# Patient Record
Sex: Male | Born: 1975 | Race: Black or African American | Hispanic: No | Marital: Single
Health system: Southern US, Community
[De-identification: ages and names within clinical notes are randomized; demographics above are authoritative.]

## PROBLEM LIST (undated history)

## (undated) DIAGNOSIS — Z981 Arthrodesis status: Secondary | ICD-10-CM

## (undated) DIAGNOSIS — S4990XA Unspecified injury of shoulder and upper arm, unspecified arm, initial encounter: Secondary | ICD-10-CM

---

## 2020-02-06 ENCOUNTER — Emergency Department (HOSPITAL_COMMUNITY): Payer: Self-pay

## 2020-02-06 ENCOUNTER — Emergency Department (HOSPITAL_COMMUNITY)
Admission: EM | Admit: 2020-02-06 | Discharge: 2020-02-06 | Disposition: A | Discharge status: Home / Self Care | Payer: Self-pay | Attending: Emergency Medicine | Admitting: Emergency Medicine

## 2020-02-06 ENCOUNTER — Other Ambulatory Visit: Payer: Self-pay

## 2020-02-06 ENCOUNTER — Encounter (HOSPITAL_COMMUNITY): Payer: Self-pay | Admitting: Emergency Medicine

## 2020-02-06 DIAGNOSIS — M25512 Pain in left shoulder: Secondary | ICD-10-CM | POA: Insufficient documentation

## 2020-02-06 DIAGNOSIS — M25519 Pain in unspecified shoulder: Secondary | ICD-10-CM

## 2020-02-06 DIAGNOSIS — M25511 Pain in right shoulder: Secondary | ICD-10-CM | POA: Insufficient documentation

## 2020-02-06 HISTORY — DX: Unspecified injury of shoulder and upper arm, unspecified arm, initial encounter: S49.90XA

## 2020-02-06 HISTORY — DX: Arthrodesis status: Z98.1

## 2020-02-06 MED ORDER — HYDROCODONE-ACETAMINOPHEN 5-325 MG PO TABS
1.0000 | ORAL_TABLET | Freq: Once | ORAL | Status: AC
  Administered 2020-02-06: 1 via ORAL
  Filled 2020-02-06: qty 1

## 2020-02-06 MED ORDER — CYCLOBENZAPRINE HCL 10 MG PO TABS
5.0000 mg | ORAL_TABLET | Freq: Once | ORAL | Status: AC
  Administered 2020-02-06: 5 mg via ORAL
  Filled 2020-02-06: qty 1

## 2020-02-06 MED ORDER — PREDNISONE 10 MG PO TABS: 40.0000 mg | ORAL_TABLET | Freq: Every day | ORAL | 0 refills | Status: AC

## 2020-02-06 MED ORDER — CYCLOBENZAPRINE HCL 10 MG PO TABS: 10.0000 mg | ORAL_TABLET | Freq: Two times a day (BID) | ORAL | 0 refills | Status: AC | PRN

## 2020-02-06 MED ORDER — PREDNISONE 20 MG PO TABS
60.0000 mg | ORAL_TABLET | Freq: Once | ORAL | Status: AC
  Administered 2020-02-06: 60 mg via ORAL
  Filled 2020-02-06: qty 3
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
## 2020-02-06 NOTE — Discharge Instructions (Signed)
You are seen today for shoulder pain.  Your x-rays were completely normal.  I believe you may have a rotator cuff or other muscular or tendinous issue.  I have referred you to orthopedics for further work-up and treatment can be given.  I have sent some prescriptions to the pharmacy for you to take.
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
## 2020-04-21 ENCOUNTER — Emergency Department (HOSPITAL_COMMUNITY): Admission: EM | Admit: 2020-04-21 | Discharge: 2020-04-21 | Discharge status: Absent without leave | Payer: Self-pay

## 2020-07-29 ENCOUNTER — Encounter (HOSPITAL_COMMUNITY): Payer: Self-pay | Admitting: Emergency Medicine

## 2020-07-29 ENCOUNTER — Emergency Department (HOSPITAL_COMMUNITY)
Admission: EM | Admit: 2020-07-29 | Discharge: 2020-07-29 | Disposition: A | Discharge status: Home / Self Care | Payer: Self-pay | Attending: Emergency Medicine | Admitting: Emergency Medicine

## 2020-07-29 ENCOUNTER — Other Ambulatory Visit: Payer: Self-pay

## 2020-07-29 DIAGNOSIS — M25511 Pain in right shoulder: Secondary | ICD-10-CM | POA: Insufficient documentation

## 2020-07-29 DIAGNOSIS — M25512 Pain in left shoulder: Secondary | ICD-10-CM | POA: Insufficient documentation

## 2020-07-29 DIAGNOSIS — M5459 Other low back pain: Secondary | ICD-10-CM | POA: Insufficient documentation

## 2020-07-29 DIAGNOSIS — G8929 Other chronic pain: Secondary | ICD-10-CM | POA: Insufficient documentation
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
## 2020-07-29 NOTE — Discharge Instructions (Signed)
You have been evaluated at the emergency department and exam look reassuring.  I recommend taking over-the-counter pain medications like ibuprofen and or Tylenol every 6 hours as needed for pain.  Please follow dosing on the back of bottle.    I have given you information for chronic pain please contact these facility as they can help you manage your pain.  I have also given you the contact information for  for community health and wellness they work with individuals little to no insurance can help you find a primary care provider.  I feel you need further management by PCP.  Come back to emergency department if develop severe chest pain, shortness of breath, uncontrolled nausea, vomiting, diarrhea.

## 2020-07-29 NOTE — ED Notes (Signed)
Pt left prior to receiving AVS. Ambulatory out of dept.

## 2020-07-29 NOTE — ED Triage Notes (Signed)
Pt arrives to ED by POV with complaints of not being able to manage his chronic pain. Specifically neck, back, and shoulders.

## 2020-08-12 ENCOUNTER — Encounter (HOSPITAL_COMMUNITY): Payer: Self-pay

## 2020-08-12 ENCOUNTER — Emergency Department (HOSPITAL_COMMUNITY)
Admission: EM | Admit: 2020-08-12 | Discharge: 2020-08-13 | Disposition: A | Discharge status: Home / Self Care | Payer: Self-pay | Attending: Emergency Medicine | Admitting: Emergency Medicine

## 2020-08-12 ENCOUNTER — Emergency Department (HOSPITAL_COMMUNITY): Payer: Self-pay

## 2020-08-12 DIAGNOSIS — G8929 Other chronic pain: Secondary | ICD-10-CM | POA: Insufficient documentation

## 2020-08-12 DIAGNOSIS — M25511 Pain in right shoulder: Secondary | ICD-10-CM | POA: Insufficient documentation
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
## 2020-08-13 ENCOUNTER — Encounter (HOSPITAL_COMMUNITY): Payer: Self-pay | Admitting: Emergency Medicine

## 2020-08-13 MED ORDER — IBUPROFEN 800 MG PO TABS
800.0000 mg | ORAL_TABLET | Freq: Once | ORAL | Status: AC
  Administered 2020-08-13: 800 mg via ORAL
  Filled 2020-08-13: qty 1

## 2020-08-13 MED ORDER — ACETAMINOPHEN 500 MG PO TABS
1000.0000 mg | ORAL_TABLET | Freq: Once | ORAL | Status: AC
  Administered 2020-08-13: 1000 mg via ORAL
  Filled 2020-08-13: qty 2

## 2020-08-13 MED ORDER — LIDOCAINE 5 % EX PTCH
1.0000 | MEDICATED_PATCH | CUTANEOUS | Status: DC
  Administered 2020-08-13: 1 via TRANSDERMAL
  Filled 2020-08-13: qty 1

## 2020-08-13 MED ORDER — LIDOCAINE 5 % EX PTCH: 1.0000 | MEDICATED_PATCH | CUTANEOUS | 0 refills | Status: AC

## 2020-08-13 NOTE — ED Notes (Signed)
Discharge instructions reviewed with patient and patient verbalized understanding. Patient was educated on the importance of following up with ortho. Patient vss and nad upon dc. Patient ambulatory without assist.
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
## 2021-06-11 IMAGING — CR DG SHOULDER 2+V*R*
3 series · 3 of 3 positions shown · non-contrast
Comparison: 02/06/2020

CLINICAL DATA: Shoulder pain

EXAM:
RIGHT SHOULDER - 2+ VIEW

[shoulder grashey]
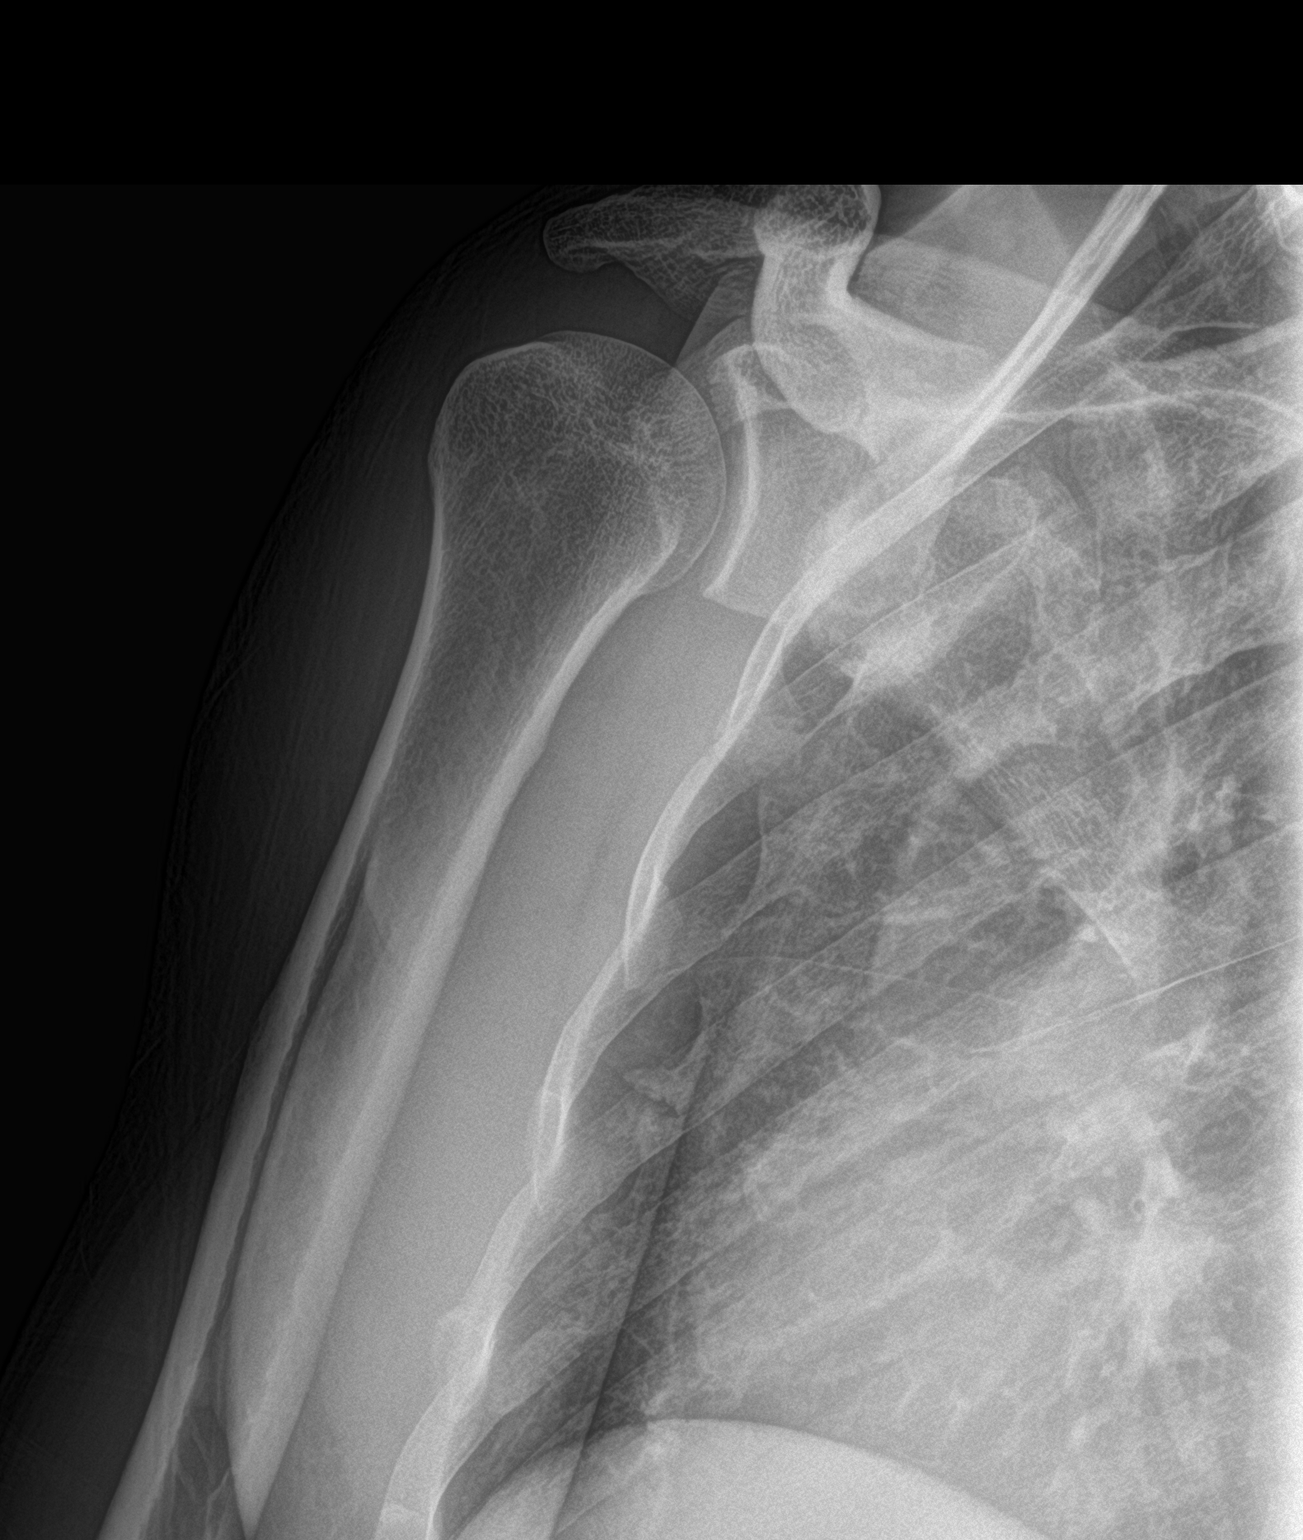

[shoulder y view]
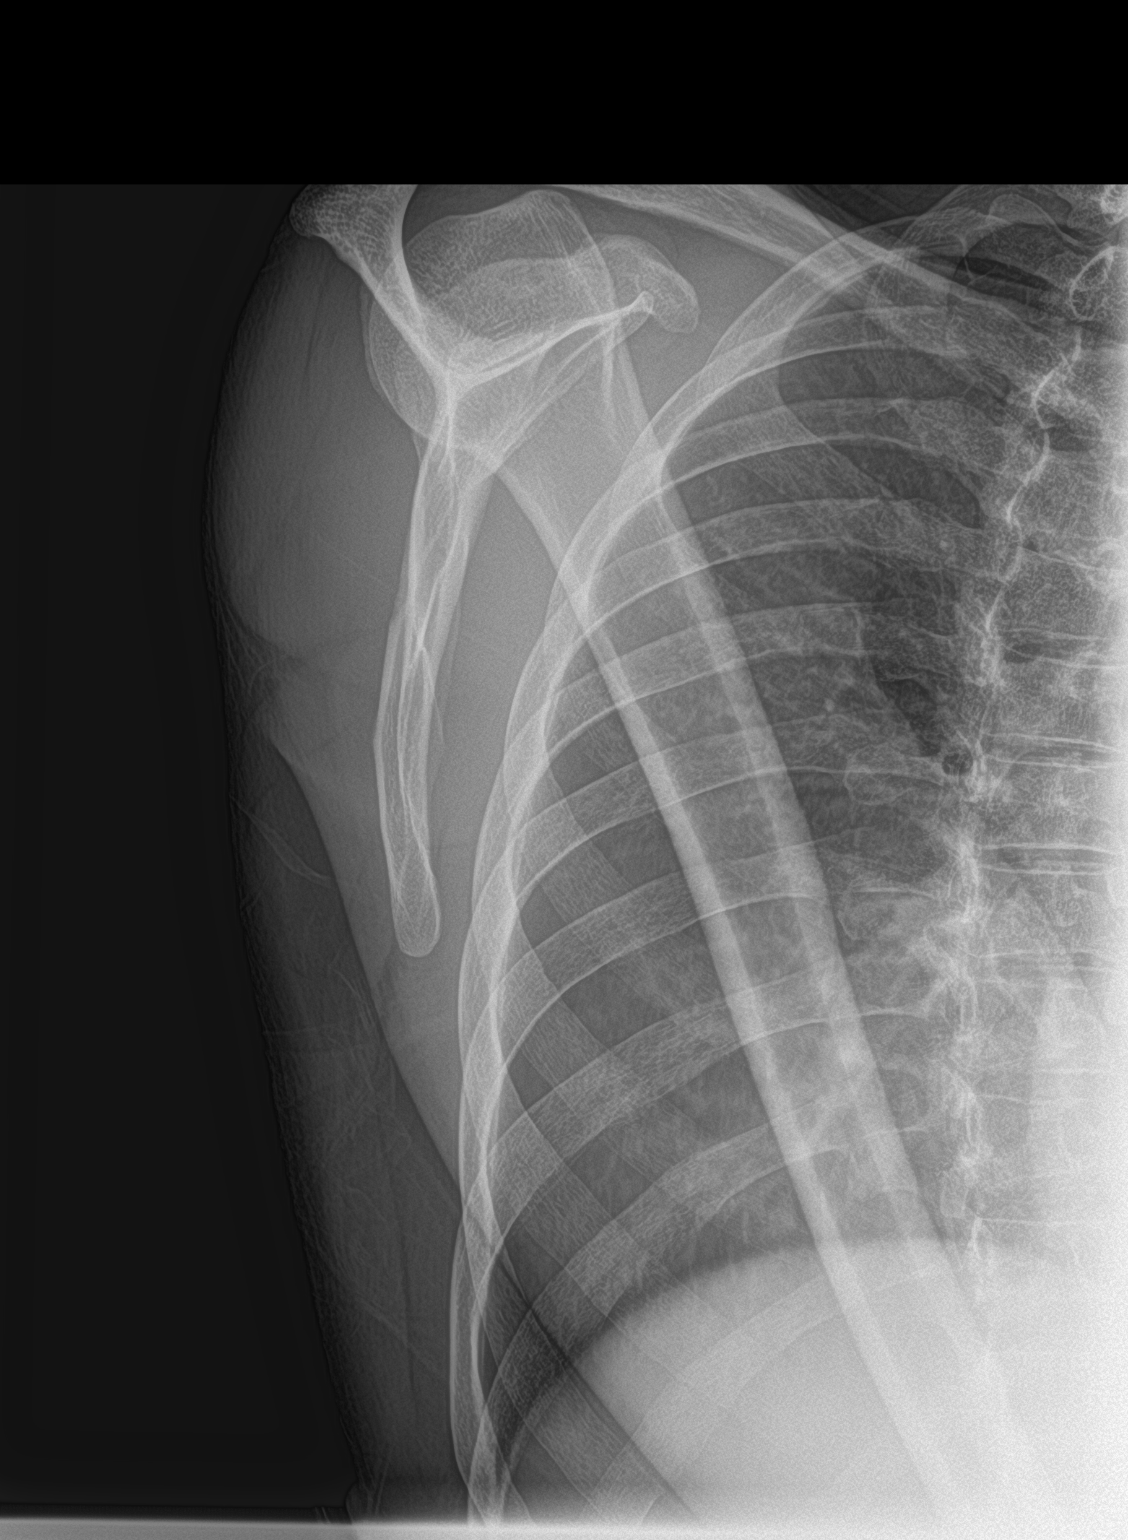

[shoulder axillary]
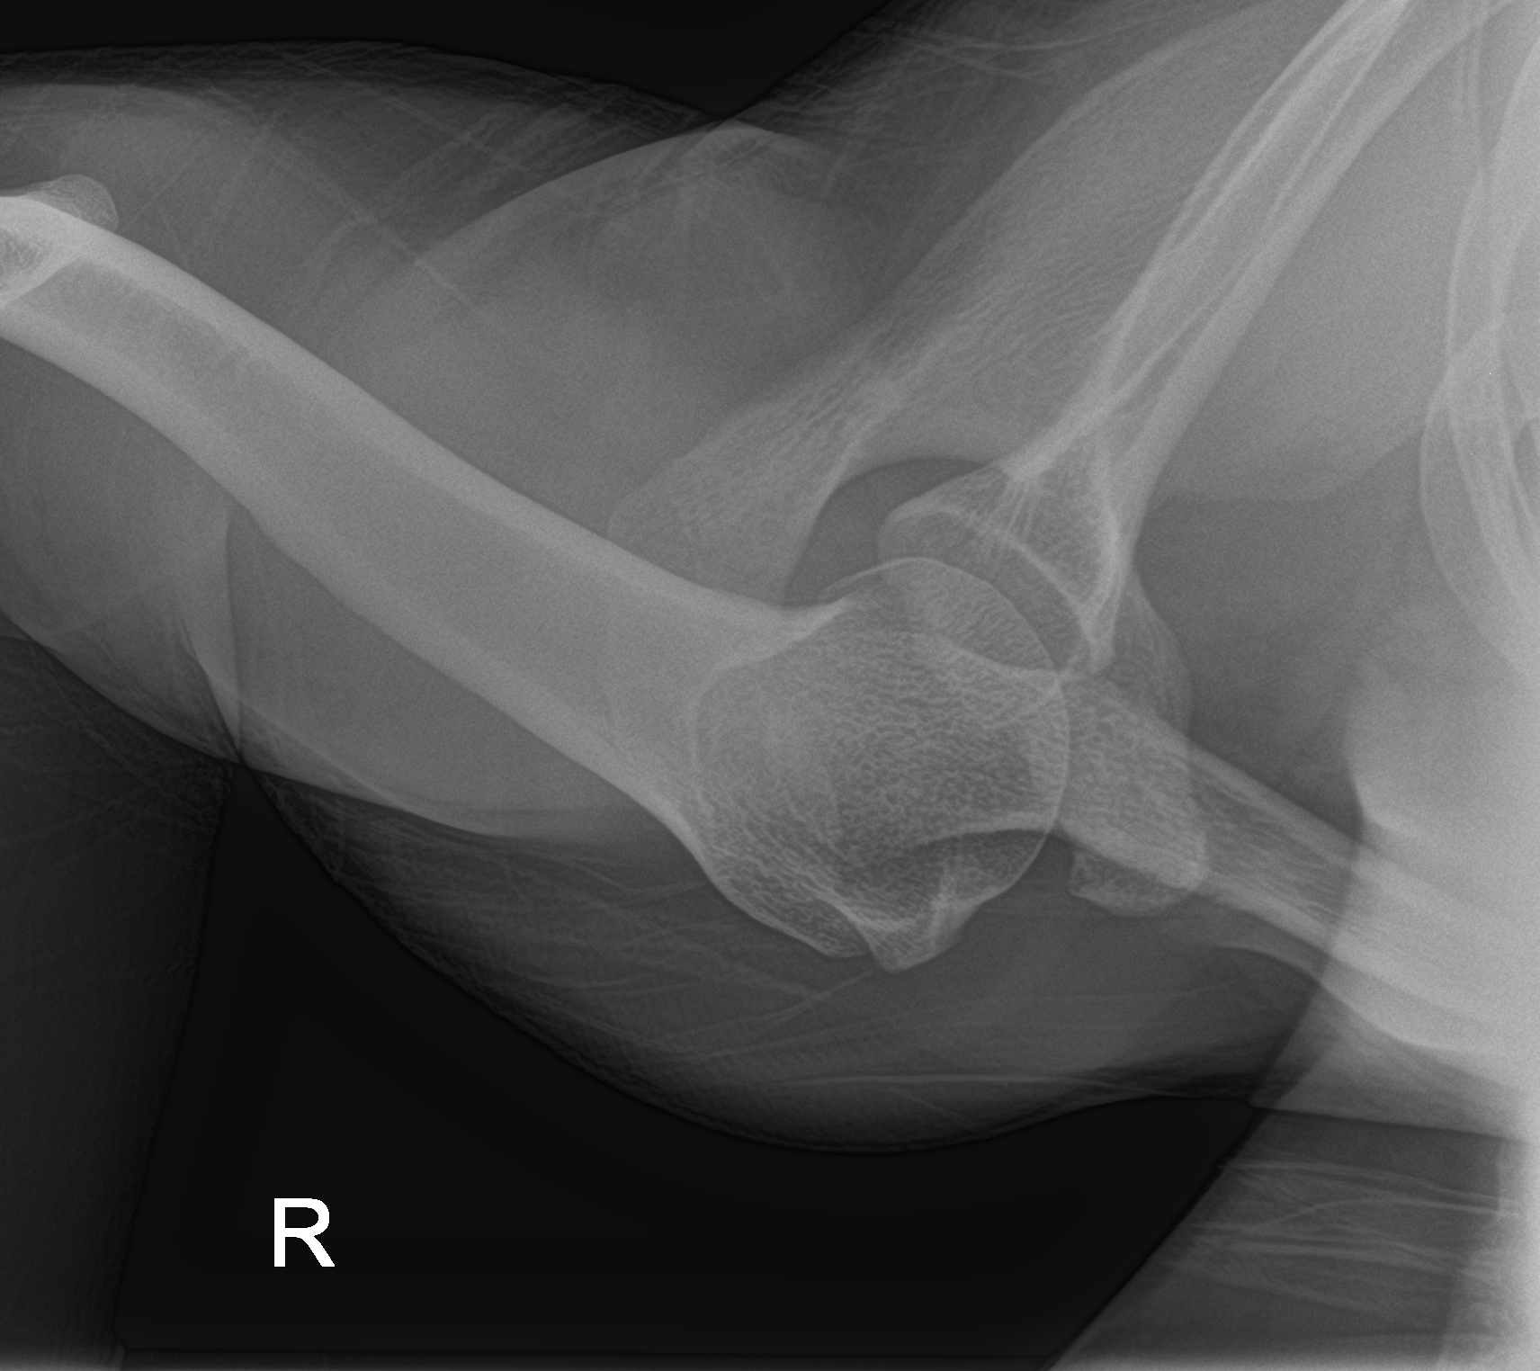

[3 of 3 positions shown; findings below may reference images not displayed]

FINDINGS: There is no evidence of fracture or dislocation. There is no
evidence of arthropathy or other focal bone abnormality. Soft
tissues are unremarkable.
IMPRESSION: Negative.
